# Patient Record
Sex: Female | Born: 1990 | Race: White | Hispanic: No | Marital: Single | Smoking: Never smoker
Health system: Southern US, Community
[De-identification: ages and names within clinical notes are randomized; demographics above are authoritative.]

## PROBLEM LIST (undated history)

## (undated) DIAGNOSIS — M5126 Other intervertebral disc displacement, lumbar region: Secondary | ICD-10-CM

## (undated) HISTORY — PX: BACK SURGERY: SHX140

## (undated) HISTORY — PX: WISDOM TOOTH EXTRACTION: SHX21

## (undated) HISTORY — DX: Other intervertebral disc displacement, lumbar region: M51.26

---
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
## 2009-01-16 ENCOUNTER — Ambulatory Visit: Payer: Self-pay | Admitting: Gynecology

## 2009-02-12 ENCOUNTER — Ambulatory Visit: Payer: Self-pay | Admitting: Gynecology

## 2009-02-12 ENCOUNTER — Other Ambulatory Visit: Admission: RE | Admit: 2009-02-12 | Discharge: 2009-02-12 | Payer: Self-pay | Admitting: Gynecology

## 2009-02-12 ENCOUNTER — Encounter: Payer: Self-pay | Admitting: Gynecology

## 2009-06-15 ENCOUNTER — Ambulatory Visit: Payer: Self-pay | Admitting: Gynecology

## 2009-11-12 ENCOUNTER — Ambulatory Visit: Payer: Self-pay | Admitting: Gynecology

## 2010-02-13 ENCOUNTER — Other Ambulatory Visit: Admission: RE | Admit: 2010-02-13 | Discharge: 2010-02-13 | Payer: Self-pay | Admitting: Gynecology

## 2010-02-13 ENCOUNTER — Ambulatory Visit: Payer: Self-pay | Admitting: Gynecology

## 2010-05-14 ENCOUNTER — Ambulatory Visit: Payer: Self-pay | Admitting: Gynecology

## 2012-04-01 ENCOUNTER — Encounter: Payer: Self-pay | Admitting: Gynecology

## 2012-04-01 ENCOUNTER — Ambulatory Visit (INDEPENDENT_AMBULATORY_CARE_PROVIDER_SITE_OTHER): Payer: Self-pay | Admitting: Gynecology

## 2012-04-01 VITALS — BP 120/80 | Ht 66.5 in | Wt 156.0 lb

## 2012-04-01 DIAGNOSIS — Z01419 Encounter for gynecological examination (general) (routine) without abnormal findings: Secondary | ICD-10-CM

## 2012-04-01 DIAGNOSIS — A499 Bacterial infection, unspecified: Secondary | ICD-10-CM

## 2012-04-01 DIAGNOSIS — N898 Other specified noninflammatory disorders of vagina: Secondary | ICD-10-CM

## 2012-04-01 DIAGNOSIS — B9689 Other specified bacterial agents as the cause of diseases classified elsewhere: Secondary | ICD-10-CM

## 2012-04-01 DIAGNOSIS — Z113 Encounter for screening for infections with a predominantly sexual mode of transmission: Secondary | ICD-10-CM

## 2012-04-01 DIAGNOSIS — N760 Acute vaginitis: Secondary | ICD-10-CM

## 2012-04-01 LAB — CBC WITH DIFFERENTIAL/PLATELET
Basophils Absolute: 0 10*3/uL (ref 0.0–0.1)
Basophils Relative: 0 % (ref 0–1)
Eosinophils Absolute: 0.2 10*3/uL (ref 0.0–0.7)
Eosinophils Relative: 3 % (ref 0–5)
HCT: 38.8 % (ref 36.0–46.0)
Hemoglobin: 12.7 g/dL (ref 12.0–15.0)
Lymphocytes Relative: 34 % (ref 12–46)
Lymphs Abs: 2 10*3/uL (ref 0.7–4.0)
MCH: 29.9 pg (ref 26.0–34.0)
MCHC: 32.7 g/dL (ref 30.0–36.0)
MCV: 91.3 fL (ref 78.0–100.0)
Monocytes Absolute: 0.4 10*3/uL (ref 0.1–1.0)
Monocytes Relative: 7 % (ref 3–12)
Neutro Abs: 3.2 10*3/uL (ref 1.7–7.7)
Neutrophils Relative %: 56 % (ref 43–77)
Platelets: 223 10*3/uL (ref 150–400)
RBC: 4.25 MIL/uL (ref 3.87–5.11)
RDW: 13.6 % (ref 11.5–15.5)
WBC: 5.7 10*3/uL (ref 4.0–10.5)

## 2012-04-01 LAB — WET PREP FOR TRICH, YEAST, CLUE
Trich, Wet Prep: NONE SEEN
Yeast Wet Prep HPF POC: NONE SEEN

## 2012-04-01 MED ORDER — NORGESTIMATE-ETH ESTRADIOL 0.25-35 MG-MCG PO TABS: 1.0000 | ORAL_TABLET | Freq: Every day | ORAL | Status: DC

## 2012-04-01 MED ORDER — METRONIDAZOLE 500 MG PO TABS: 500.0000 mg | ORAL_TABLET | Freq: Two times a day (BID) | ORAL | Status: AC

## 2012-04-01 NOTE — Progress Notes (Signed)
Michelle Anderson 1991-03-10 [id number]        21 y.o.  for annual exam.  Several issues noted below.  Past medical history,surgical history, medications, allergies, family history and social history were all reviewed and documented in the EPIC chart. ROS:  Was performed and pertinent positives and negatives are included in the history.  Exam: Michelle Anderson chaperone present Filed Vitals:   04/01/12 1018  BP: 120/80   General appearance  Normal Skin grossly normal Head/Neck normal with no cervical or supraclavicular adenopathy thyroid normal Lungs  clear Cardiac RR, without RMG Abdominal  soft, nontender, without masses, organomegaly or hernia Breasts  examined lying and sitting without masses, retractions, discharge or axillary adenopathy. Pelvic  Ext/BUS/vagina  normal with white discharge  Cervix  normal   Uterus  anteverted, normal size, shape and contour, midline and mobile nontender   Adnexa  Without masses or tenderness    Anus and perineum  normal      Assessment/Plan:  21 y.o. female for annual exam.    1. Contraception. Patient had stopped the birth control pills and she is now 6 active and has no insurance. I reviewed options of monitoring at present versus starting a generic such as Sprintec. Our recommendations would be to go ahead and start the birth control pill now as then she will have protection if she chooses to become sexually active. Patient agrees and I prescribed Sprintec equivalent x1 year. Back up contraception/decreased STD risk with condoms stressed. 2. Vaginal discharge. Patient notes vaginal discharge with vulvar swelling of the past week or 2. Wet prep/exam consistent with bacterial vaginosis. We'll treat with Flagyl 500 mg twice a day x7 days, alcohol avoidance reviewed. 3. STD screening. Patient requested GC and Chlamydia. No known exposure to just wants to be screened and this was done. 4. Pap smear. Patient's last Pap smear was 2011. No history of abnormal  Pap smears before.  Per current screening guidelines we'll plan every 3 year Pap and no Pap was done today. 5. Breast health. SBE monthly reviewed. 6. Gardasil. Patient has received the series. 7. Health maintenance. Baseline CBC urinalysis ordered. No other blood work done.    Michelle Lords MD, 11:12 AM 04/01/2012

## 2012-04-01 NOTE — Patient Instructions (Signed)
Take Flagyl/metronidazole twice daily for 7 days, alcohol avoidance. Follow up if discharge/swelling persists. Start on oral contraceptives as discussed. Follow up in one year for exam.

## 2012-04-02 LAB — URINALYSIS W MICROSCOPIC + REFLEX CULTURE
Bilirubin Urine: NEGATIVE
Casts: NONE SEEN
Glucose, UA: NEGATIVE mg/dL
Hgb urine dipstick: NEGATIVE
Leukocytes, UA: NEGATIVE
Protein, ur: NEGATIVE mg/dL
pH: 7.5 (ref 5.0–8.0)

## 2012-04-03 LAB — URINE CULTURE

## 2012-04-05 ENCOUNTER — Telehealth: Payer: Self-pay | Admitting: Gynecology

## 2012-04-05 MED ORDER — AMOXICILLIN 500 MG PO TABS: 500.0000 mg | ORAL_TABLET | Freq: Two times a day (BID) | ORAL | Status: AC

## 2012-04-05 NOTE — Telephone Encounter (Signed)
Pt informed with the below. 

## 2012-04-05 NOTE — Telephone Encounter (Signed)
Tell patient that her urine grew out bacteria. I want to treat her with antibiotic. Make sure she uses backup contraception for the month of antibiotic use.

## 2012-10-26 ENCOUNTER — Encounter: Payer: Self-pay | Admitting: Gynecology

## 2012-10-26 ENCOUNTER — Ambulatory Visit (INDEPENDENT_AMBULATORY_CARE_PROVIDER_SITE_OTHER): Payer: BC Managed Care – PPO | Admitting: Gynecology

## 2012-10-26 DIAGNOSIS — N390 Urinary tract infection, site not specified: Secondary | ICD-10-CM

## 2012-10-26 DIAGNOSIS — R300 Dysuria: Secondary | ICD-10-CM

## 2012-10-26 DIAGNOSIS — Z309 Encounter for contraceptive management, unspecified: Secondary | ICD-10-CM

## 2012-10-26 LAB — URINALYSIS W MICROSCOPIC + REFLEX CULTURE
Bilirubin Urine: NEGATIVE
Casts: NONE SEEN
Crystals: NONE SEEN
Hgb urine dipstick: NEGATIVE
Ketones, ur: NEGATIVE mg/dL
RBC / HPF: NONE SEEN RBC/hpf (ref <3)
Specific Gravity, Urine: 1.02 (ref 1.005–1.030)
pH: 7.5 (ref 5.0–8.0)

## 2012-10-26 MED ORDER — SULFAMETHOXAZOLE-TMP DS 800-160 MG PO TABS: 1.0000 | ORAL_TABLET | Freq: Two times a day (BID) | ORAL | Status: DC

## 2012-10-26 NOTE — Progress Notes (Signed)
Patient presents with: 1. Several weeks of slight dysuria. No we'll frequency urgency low back pain fever chills or, pain. She does have a history of UTIs in the past and says she feels like she has a urinary tract infection. 2. Also wants to talk about birth control. Had been on Yasmin have a lot of emotional swings and we tried her on Sprintec and she continued with days swings and she stopped the control pills.   Exam was Set designer Spine straight no CVA tenderness Abdomen soft nontender without masses guarding rebound organomegaly Pelvic external BUS vagina normal. Cervix normal. Uterus normal size, mobile nontender. Adnexa without masses or tenderness.  Assessment and plan: 1. UTI. UA is consistent with a low level cystitis with 3-6 WBCs few bacteria positive leukocyte esterase. We'll cover with Septra DS one by mouth twice a day x3 days. Follow up if symptoms persist or recur. 2. Contraceptive management.  I extensively reviewed options for contraception to include pill patch ring Implanon Depo-Provera IUD. The pros/cons, risks/benefits of each choice reviewed. Options to try a different pill versus IUD discussed. She is not interested in NuvaRing or patch Implanon or Depo-Provera.  What is involved with IUD placement and the risks benefits to include infection/failure and hormonal absorption with hormonal side effects discussed.  Patient wants to try a different pill at first and then if this does not seem to alleviate her emotional swings that she will follow up for Mirena IUD placement. Lo Loestrin samples packs x2 given. Patient will call me into the second pack to let me know how she's doing and either prescribe a refill through her annual come to June/July 2014 or she'll follow for the IUD.

## 2012-10-26 NOTE — Patient Instructions (Signed)
Follow-up with contraceptive decision. 

## 2012-10-28 LAB — URINE CULTURE: Colony Count: NO GROWTH

## 2012-11-15 ENCOUNTER — Telehealth: Payer: Self-pay | Admitting: *Deleted

## 2012-11-15 NOTE — Telephone Encounter (Signed)
Pt called c/o spotting while taking new birth control lo Loestrin FE. Pt said she has been on now for about 2 weeks. Pt is taking daily as directed same time and date. I explained to pt that not usually to have spotting with new pills. Pt will wait and follow up to see if spotting should resolve.

## 2012-12-24 ENCOUNTER — Telehealth: Payer: Self-pay | Admitting: *Deleted

## 2012-12-24 MED ORDER — NORETHIN-ETH ESTRAD-FE BIPHAS 1 MG-10 MCG / 10 MCG PO TABS: 1.0000 | ORAL_TABLET | Freq: Every day | ORAL | Status: DC

## 2012-12-24 NOTE — Telephone Encounter (Signed)
Pt called requesting rx for lo loestrin fe , per note on 10/26/12, rx sent.

## 2013-03-21 ENCOUNTER — Ambulatory Visit (INDEPENDENT_AMBULATORY_CARE_PROVIDER_SITE_OTHER): Payer: BC Managed Care – PPO | Admitting: Gynecology

## 2013-03-21 ENCOUNTER — Encounter: Payer: Self-pay | Admitting: Gynecology

## 2013-03-21 DIAGNOSIS — N390 Urinary tract infection, site not specified: Secondary | ICD-10-CM

## 2013-03-21 DIAGNOSIS — R3915 Urgency of urination: Secondary | ICD-10-CM

## 2013-03-21 DIAGNOSIS — Z113 Encounter for screening for infections with a predominantly sexual mode of transmission: Secondary | ICD-10-CM

## 2013-03-21 LAB — URINALYSIS W MICROSCOPIC + REFLEX CULTURE
Bilirubin Urine: NEGATIVE
Glucose, UA: NEGATIVE mg/dL
Ketones, ur: NEGATIVE mg/dL
Protein, ur: 30 mg/dL — AB
Urobilinogen, UA: 0.2 mg/dL (ref 0.0–1.0)

## 2013-03-21 LAB — WET PREP FOR TRICH, YEAST, CLUE: Clue Cells Wet Prep HPF POC: NONE SEEN

## 2013-03-21 MED ORDER — SULFAMETHOXAZOLE-TRIMETHOPRIM 800-160 MG PO TABS: 1.0000 | ORAL_TABLET | Freq: Two times a day (BID) | ORAL | Status: DC

## 2013-03-21 NOTE — Progress Notes (Signed)
Patient presents with 2 issues: 1. Frequency and urgency. Similar symptoms 2 weeks ago. Took some of her grandmothers antibiotics. Her symptoms resolved but now have returned over the last 24 hours. No fever chills nausea vomiting low back pain. Does have history of UTIs in the past. 2. STD screening request. Boyfriend has been unfaithful. No known exposure but wants to be checked.   Exam with Selena Batten assistant Spine straight without CVA tenderness Abdomen soft nontender without masses guarding rebound organomegaly. Pelvic external BUS vagina normal. Cervix normal. Uterus normal sized mobile nontender. Adnexa without masses or tenderness.  Assessment and plan: 1. Symptoms and urinalysis consistent with UTI. Treat with Septra DS 1 by mouth twice a day x5 days. Followup if symptoms persist, worsen or recur.  2. STD screening. GC chlamydia HIV RPR hepatitis B hepatitis C wet prep done. Wet prep is negative. Followup for other results. Patient has annual exam scheduled end of the month and she'll followup for this.

## 2013-03-21 NOTE — Patient Instructions (Signed)
Take Septra antibiotic twice daily for 5 days. Followup if symptoms persist, worsen or recur. Followup online my chart to check your STD results. Follow up for her annual exam as scheduled.

## 2013-03-22 LAB — GC/CHLAMYDIA PROBE AMP: GC Probe RNA: NEGATIVE

## 2013-03-23 ENCOUNTER — Other Ambulatory Visit: Payer: BC Managed Care – PPO

## 2013-03-23 LAB — URINE CULTURE: Colony Count: NO GROWTH

## 2013-03-24 LAB — RPR

## 2013-04-14 ENCOUNTER — Encounter: Payer: Self-pay | Admitting: Gynecology

## 2013-04-14 ENCOUNTER — Other Ambulatory Visit (HOSPITAL_COMMUNITY)
Admission: RE | Admit: 2013-04-14 | Discharge: 2013-04-14 | Disposition: A | Discharge status: Home / Self Care | Payer: BC Managed Care – PPO | Source: Ambulatory Visit | Attending: Gynecology | Admitting: Gynecology

## 2013-04-14 ENCOUNTER — Ambulatory Visit (INDEPENDENT_AMBULATORY_CARE_PROVIDER_SITE_OTHER): Payer: BC Managed Care – PPO | Admitting: Gynecology

## 2013-04-14 VITALS — BP 112/66 | Ht 66.5 in | Wt 153.0 lb

## 2013-04-14 DIAGNOSIS — Z01419 Encounter for gynecological examination (general) (routine) without abnormal findings: Secondary | ICD-10-CM

## 2013-04-14 LAB — CBC WITH DIFFERENTIAL/PLATELET
Basophils Absolute: 0 10*3/uL (ref 0.0–0.1)
Eosinophils Absolute: 0.1 10*3/uL (ref 0.0–0.7)
Eosinophils Relative: 2 % (ref 0–5)
Lymphocytes Relative: 26 % (ref 12–46)
MCH: 30.5 pg (ref 26.0–34.0)
MCV: 89.8 fL (ref 78.0–100.0)
Neutrophils Relative %: 65 % (ref 43–77)
Platelets: 211 10*3/uL (ref 150–400)
RDW: 14.1 % (ref 11.5–15.5)
WBC: 5.5 10*3/uL (ref 4.0–10.5)

## 2013-04-14 LAB — GLUCOSE, RANDOM: Glucose, Bld: 82 mg/dL (ref 70–99)

## 2013-04-14 MED ORDER — NORETHIN-ETH ESTRAD-FE BIPHAS 1 MG-10 MCG / 10 MCG PO TABS: 1.0000 | ORAL_TABLET | Freq: Every day | ORAL | Status: DC

## 2013-04-14 NOTE — Patient Instructions (Signed)
Follow-up in 1 year for annual exam 

## 2013-04-14 NOTE — Addendum Note (Signed)
Addended by: Dayna Barker on: 04/14/2013 10:08 AM   Modules accepted: Orders

## 2013-04-14 NOTE — Progress Notes (Signed)
Michelle Anderson [birth year] [id number]        22 y.o.  G0P0 for annual exam.  Doing well without complaints.  Past medical history,surgical history, medications, allergies, family history and social history were all reviewed and documented in the EPIC chart.  ROS:  Performed and pertinent positives and negatives are included in the history, assessment and plan .  Exam: Kim assistant Filed Vitals:   04/14/13 0939  BP: 112/66  Height: 5' 6.5" (1.689 m)  Weight: 153 lb (69.4 kg)   General appearance  Normal Skin grossly normal Head/Neck normal with no cervical or supraclavicular adenopathy thyroid normal Lungs  clear Cardiac RR, without RMG Abdominal  soft, nontender, without masses, organomegaly or hernia Breasts  examined lying and sitting without masses, retractions, discharge or axillary adenopathy. Pelvic  Ext/BUS/vagina  normal   Cervix  normal Pap  Uterus  anteverted, normal size, shape and contour, midline and mobile nontender   Adnexa  Without masses or tenderness    Anus and perineum  normal       Assessment/Plan:  22 y.o. G0P0 female for annual exam.   1. Contraception. Patient on low-dose BCPs doing well treated once to continue. I refilled her times a year. 2. Breast health. SBE monthly reviewed. 3. Pap smear 2011. Pap done today. Plan 3 year repeat assuming normal. 4. STD screening recently done in June and all negative. 5. Gardasil series received. 6. Health maintenance. Baseline CBC glucose urinalysis ordered. Followup one year, sooner as needed.  Note: This document was prepared with digital dictation and possible smart phrase technology. Any transcriptional errors that result from this process are unintentional.   Dara Lords MD, 10:01 AM 04/14/2013

## 2013-04-15 LAB — URINALYSIS W MICROSCOPIC + REFLEX CULTURE
Bacteria, UA: NONE SEEN
Bilirubin Urine: NEGATIVE
Ketones, ur: NEGATIVE mg/dL
Protein, ur: NEGATIVE mg/dL
Urobilinogen, UA: 0.2 mg/dL (ref 0.0–1.0)

## 2013-09-09 ENCOUNTER — Other Ambulatory Visit: Payer: Self-pay | Admitting: Neurosurgery

## 2013-09-09 DIAGNOSIS — M5126 Other intervertebral disc displacement, lumbar region: Secondary | ICD-10-CM

## 2013-09-13 ENCOUNTER — Ambulatory Visit
Admission: RE | Admit: 2013-09-13 | Discharge: 2013-09-13 | Disposition: A | Discharge status: Home / Self Care | Payer: BC Managed Care – PPO | Source: Ambulatory Visit | Attending: Neurosurgery | Admitting: Neurosurgery

## 2013-09-13 VITALS — BP 126/81 | HR 64

## 2013-09-13 DIAGNOSIS — M5126 Other intervertebral disc displacement, lumbar region: Secondary | ICD-10-CM

## 2013-09-13 MED ORDER — METHYLPREDNISOLONE ACETATE 40 MG/ML INJ SUSP (RADIOLOG
120.0000 mg | Freq: Once | INTRAMUSCULAR | Status: AC
  Administered 2013-09-13: 120 mg via EPIDURAL

## 2013-09-13 MED ORDER — IOHEXOL 180 MG/ML  SOLN
1.0000 mL | Freq: Once | INTRAMUSCULAR | Status: AC | PRN
  Administered 2013-09-13: 1 mL via EPIDURAL

## 2014-02-01 ENCOUNTER — Other Ambulatory Visit: Payer: Self-pay | Admitting: Gynecology

## 2014-04-18 ENCOUNTER — Ambulatory Visit (INDEPENDENT_AMBULATORY_CARE_PROVIDER_SITE_OTHER): Payer: 59 | Admitting: Gynecology

## 2014-04-18 ENCOUNTER — Encounter: Payer: Self-pay | Admitting: Gynecology

## 2014-04-18 VITALS — BP 110/70 | Ht 67.0 in | Wt 169.0 lb

## 2014-04-18 DIAGNOSIS — Z01419 Encounter for gynecological examination (general) (routine) without abnormal findings: Secondary | ICD-10-CM

## 2014-04-18 DIAGNOSIS — Z113 Encounter for screening for infections with a predominantly sexual mode of transmission: Secondary | ICD-10-CM

## 2014-04-18 LAB — CBC WITH DIFFERENTIAL/PLATELET
BASOS ABS: 0 10*3/uL (ref 0.0–0.1)
Basophils Relative: 0 % (ref 0–1)
EOS ABS: 0.1 10*3/uL (ref 0.0–0.7)
EOS PCT: 3 % (ref 0–5)
HEMATOCRIT: 38.9 % (ref 36.0–46.0)
Hemoglobin: 13.4 g/dL (ref 12.0–15.0)
Lymphocytes Relative: 33 % (ref 12–46)
Lymphs Abs: 1.6 10*3/uL (ref 0.7–4.0)
MCH: 30.7 pg (ref 26.0–34.0)
MCHC: 34.4 g/dL (ref 30.0–36.0)
MCV: 89 fL (ref 78.0–100.0)
MONO ABS: 0.4 10*3/uL (ref 0.1–1.0)
Monocytes Relative: 8 % (ref 3–12)
Neutro Abs: 2.7 10*3/uL (ref 1.7–7.7)
Neutrophils Relative %: 56 % (ref 43–77)
PLATELETS: 202 10*3/uL (ref 150–400)
RBC: 4.37 MIL/uL (ref 3.87–5.11)
RDW: 13.3 % (ref 11.5–15.5)
WBC: 4.8 10*3/uL (ref 4.0–10.5)

## 2014-04-18 LAB — GLUCOSE, RANDOM: Glucose, Bld: 78 mg/dL (ref 70–99)

## 2014-04-18 MED ORDER — NORETHIN-ETH ESTRAD-FE BIPHAS 1 MG-10 MCG / 10 MCG PO TABS: ORAL_TABLET | ORAL | Status: DC

## 2014-04-18 NOTE — Patient Instructions (Signed)
Followup in one year, sooner as needed.  You may obtain a copy of any labs that were done today by logging onto MyChart as outlined in the instructions provided with your AVS (after visit summary). The office will not call with normal lab results but certainly if there are any significant abnormalities then we will contact you.   Health Maintenance, Female A healthy lifestyle and preventative care can promote health and wellness.  Maintain regular health, dental, and eye exams.  Eat a healthy diet. Foods like vegetables, fruits, whole grains, low-fat dairy products, and lean protein foods contain the nutrients you need without too many calories. Decrease your intake of foods high in solid fats, added sugars, and salt. Get information about a proper diet from your caregiver, if necessary.  Regular physical exercise is one of the most important things you can do for your health. Most adults should get at least 150 minutes of moderate-intensity exercise (any activity that increases your heart rate and causes you to sweat) each week. In addition, most adults need muscle-strengthening exercises on 2 or more days a week.   Maintain a healthy weight. The body mass index (BMI) is a screening tool to identify possible weight problems. It provides an estimate of body fat based on height and weight. Your caregiver can help determine your BMI, and can help you achieve or maintain a healthy weight. For adults 20 years and older:  A BMI below 18.5 is considered underweight.  A BMI of 18.5 to 24.9 is normal.  A BMI of 25 to 29.9 is considered overweight.  A BMI of 30 and above is considered obese.  Maintain normal blood lipids and cholesterol by exercising and minimizing your intake of saturated fat. Eat a balanced diet with plenty of fruits and vegetables. Blood tests for lipids and cholesterol should begin at age 41 and be repeated every 5 years. If your lipid or cholesterol levels are high, you are over  50, or you are a high risk for heart disease, you may need your cholesterol levels checked more frequently.Ongoing high lipid and cholesterol levels should be treated with medicines if diet and exercise are not effective.  If you smoke, find out from your caregiver how to quit. If you do not use tobacco, do not start.  Lung cancer screening is recommended for adults aged 49 80 years who are at high risk for developing lung cancer because of a history of smoking. Yearly low-dose computed tomography (CT) is recommended for people who have at least a 30-pack-year history of smoking and are a current smoker or have quit within the past 15 years. A pack year of smoking is smoking an average of 1 pack of cigarettes a day for 1 year (for example: 1 pack a day for 30 years or 2 packs a day for 15 years). Yearly screening should continue until the smoker has stopped smoking for at least 15 years. Yearly screening should also be stopped for people who develop a health problem that would prevent them from having lung cancer treatment.  If you are pregnant, do not drink alcohol. If you are breastfeeding, be very cautious about drinking alcohol. If you are not pregnant and choose to drink alcohol, do not exceed 1 drink per day. One drink is considered to be 12 ounces (355 mL) of beer, 5 ounces (148 mL) of wine, or 1.5 ounces (44 mL) of liquor.  Avoid use of street drugs. Do not share needles with anyone. Ask for help  if you need support or instructions about stopping the use of drugs.  High blood pressure causes heart disease and increases the risk of stroke. Blood pressure should be checked at least every 1 to 2 years. Ongoing high blood pressure should be treated with medicines, if weight loss and exercise are not effective.  If you are 55 to 24 years old, ask your caregiver if you should take aspirin to prevent strokes.  Diabetes screening involves taking a blood sample to check your fasting blood sugar level.  This should be done once every 3 years, after age 45, if you are within normal weight and without risk factors for diabetes. Testing should be considered at a younger age or be carried out more frequently if you are overweight and have at least 1 risk factor for diabetes.  Breast cancer screening is essential preventative care for women. You should practice "breast self-awareness." This means understanding the normal appearance and feel of your breasts and may include breast self-examination. Any changes detected, no matter how small, should be reported to a caregiver. Women in their 20s and 30s should have a clinical breast exam (CBE) by a caregiver as part of a regular health exam every 1 to 3 years. After age 40, women should have a CBE every year. Starting at age 40, women should consider having a mammogram (breast X-ray) every year. Women who have a family history of breast cancer should talk to their caregiver about genetic screening. Women at a high risk of breast cancer should talk to their caregiver about having an MRI and a mammogram every year.  Breast cancer gene (BRCA)-related cancer risk assessment is recommended for women who have family members with BRCA-related cancers. BRCA-related cancers include breast, ovarian, tubal, and peritoneal cancers. Having family members with these cancers may be associated with an increased risk for harmful changes (mutations) in the breast cancer genes BRCA1 and BRCA2. Results of the assessment will determine the need for genetic counseling and BRCA1 and BRCA2 testing.  The Pap test is a screening test for cervical cancer. Women should have a Pap test starting at age 21. Between ages 21 and 29, Pap tests should be repeated every 2 years. Beginning at age 30, you should have a Pap test every 3 years as long as the past 3 Pap tests have been normal. If you had a hysterectomy for a problem that was not cancer or a condition that could lead to cancer, then you no  longer need Pap tests. If you are between ages 65 and 70, and you have had normal Pap tests going back 10 years, you no longer need Pap tests. If you have had past treatment for cervical cancer or a condition that could lead to cancer, you need Pap tests and screening for cancer for at least 20 years after your treatment. If Pap tests have been discontinued, risk factors (such as a new sexual partner) need to be reassessed to determine if screening should be resumed. Some women have medical problems that increase the chance of getting cervical cancer. In these cases, your caregiver may recommend more frequent screening and Pap tests.  The human papillomavirus (HPV) test is an additional test that may be used for cervical cancer screening. The HPV test looks for the virus that can cause the cell changes on the cervix. The cells collected during the Pap test can be tested for HPV. The HPV test could be used to screen women aged 30 years and older, and   should be used in women of any age who have unclear Pap test results. After the age of 30, women should have HPV testing at the same frequency as a Pap test.  Colorectal cancer can be detected and often prevented. Most routine colorectal cancer screening begins at the age of 50 and continues through age 75. However, your caregiver may recommend screening at an earlier age if you have risk factors for colon cancer. On a yearly basis, your caregiver may provide home test kits to check for hidden blood in the stool. Use of a small camera at the end of a tube, to directly examine the colon (sigmoidoscopy or colonoscopy), can detect the earliest forms of colorectal cancer. Talk to your caregiver about this at age 50, when routine screening begins. Direct examination of the colon should be repeated every 5 to 10 years through age 75, unless early forms of pre-cancerous polyps or small growths are found.  Hepatitis C blood testing is recommended for all people born from  1945 through 1965 and any individual with known risks for hepatitis C.  Practice safe sex. Use condoms and avoid high-risk sexual practices to reduce the spread of sexually transmitted infections (STIs). Sexually active women aged 25 and younger should be checked for Chlamydia, which is a common sexually transmitted infection. Older women with new or multiple partners should also be tested for Chlamydia. Testing for other STIs is recommended if you are sexually active and at increased risk.  Osteoporosis is a disease in which the bones lose minerals and strength with aging. This can result in serious bone fractures. The risk of osteoporosis can be identified using a bone density scan. Women ages 65 and over and women at risk for fractures or osteoporosis should discuss screening with their caregivers. Ask your caregiver whether you should be taking a calcium supplement or vitamin D to reduce the rate of osteoporosis.  Menopause can be associated with physical symptoms and risks. Hormone replacement therapy is available to decrease symptoms and risks. You should talk to your caregiver about whether hormone replacement therapy is right for you.  Use sunscreen. Apply sunscreen liberally and repeatedly throughout the day. You should seek shade when your shadow is shorter than you. Protect yourself by wearing long sleeves, pants, a wide-brimmed hat, and sunglasses year round, whenever you are outdoors.  Notify your caregiver of new moles or changes in moles, especially if there is a change in shape or color. Also notify your caregiver if a mole is larger than the size of a pencil eraser.  Stay current with your immunizations. Document Released: 04/14/2011 Document Revised: 01/24/2013 Document Reviewed: 04/14/2011 [employer] Patient Information 2014 [employer].   

## 2014-04-18 NOTE — Progress Notes (Signed)
Michelle Anderson 1991-05-11 [id number]        23 y.o.  G0P0 for annual exam.  Doing well.  Past medical history,surgical history, problem list, medications, allergies, family history and social history were all reviewed and documented as reviewed in the EPIC chart.  ROS:  12 system ROS performed with pertinent positives and negatives included in the history, assessment and plan.   Additional significant findings :  none   Exam: [facility] personassistant Filed Vitals:   04/18/14 1010  BP: 110/70  Height: 5\' 7"  (1.702 m)  Weight: 169 lb (76.658 kg)   General appearance:  Normal affect, orientation and appearance. Skin: Grossly normal HEENT: Without gross lesions.  No cervical or supraclavicular adenopathy. Thyroid normal.  Lungs:  Clear without wheezing, rales or rhonchi Cardiac: RR, without RMG Abdominal:  Soft, nontender, without masses, guarding, rebound, organomegaly or hernia Breasts:  Examined lying and sitting without masses, retractions, discharge or axillary adenopathy. Pelvic:  Ext/BUS/vagina normal  Cervix normal. GC/Chlamydia done  Uterus anteverted, normal size, shape and contour, midline and mobile nontender   Adnexa  Without masses or tenderness    Anus and perineum  Normal       Assessment/Plan:  23 y.o. G0P0 female for annual exam with mild irregular menses, oral contraceptives.   1. Contraceptive management. Patient on LoLoEstrin doing well. She does have occasional skips in her menses where she will not withdraw during her pill free week. Reviewed that this is not unusual with low estrogen pills as long as she's comfortable with this from a rule out pregnancy standpoint then she can continue. She is comfortable with this I refilled her LoLoEstrin x1 year. 2. STD screening. Reviewed STD screening with her and she chose GC/chlamydia, hepatitis B/C, RPR, HIV. Currently not sexually active but need to use condoms when reviewed. 3. Breast health. SBE monthly reviewed. 4. Pap  smear 2014 negative. The Pap smear done today. No history of abnormal Pap smears previously.Plan repeat at 3 year interval per current screening guidelines. 5. Gardasil series received. 6. Health maintenance. Glucose last CBC ordered along with her STD blood work. Followup in one year, sooner as needed.   Note: This document was prepared with digital dictation and possible smart phrase technology. Any transcriptional errors that result from this process are unintentional.   Dara LordsFONTAINE,Willem Klingensmith P MD, 10:35 AM 04/18/2014

## 2014-04-19 LAB — HEPATITIS B SURFACE ANTIGEN: HEP B S AG: NEGATIVE

## 2014-04-19 LAB — GC/CHLAMYDIA PROBE AMP
CT PROBE, AMP APTIMA: NEGATIVE
GC PROBE AMP APTIMA: NEGATIVE

## 2014-04-19 LAB — HIV ANTIBODY (ROUTINE TESTING W REFLEX): HIV: NONREACTIVE

## 2014-04-19 LAB — HEPATITIS C ANTIBODY: HCV Ab: NEGATIVE

## 2014-04-19 LAB — RPR

## 2015-03-15 ENCOUNTER — Other Ambulatory Visit: Payer: Self-pay | Admitting: Gynecology

## 2015-04-05 ENCOUNTER — Other Ambulatory Visit: Payer: Self-pay | Admitting: Gynecology

## 2015-04-26 ENCOUNTER — Encounter: Payer: Self-pay | Admitting: Gynecology

## 2015-04-26 ENCOUNTER — Ambulatory Visit (INDEPENDENT_AMBULATORY_CARE_PROVIDER_SITE_OTHER): Payer: 59 | Admitting: Gynecology

## 2015-04-26 VITALS — BP 120/70 | Ht 67.0 in | Wt 193.0 lb

## 2015-04-26 DIAGNOSIS — Z01419 Encounter for gynecological examination (general) (routine) without abnormal findings: Secondary | ICD-10-CM

## 2015-04-26 DIAGNOSIS — Z113 Encounter for screening for infections with a predominantly sexual mode of transmission: Secondary | ICD-10-CM

## 2015-04-26 LAB — COMPREHENSIVE METABOLIC PANEL
ALT: 12 U/L (ref 0–35)
AST: 13 U/L (ref 0–37)
Albumin: 3.9 g/dL (ref 3.5–5.2)
Alkaline Phosphatase: 33 U/L — ABNORMAL LOW (ref 39–117)
BILIRUBIN TOTAL: 0.9 mg/dL (ref 0.2–1.2)
BUN: 10 mg/dL (ref 6–23)
CHLORIDE: 101 meq/L (ref 96–112)
CO2: 25 mEq/L (ref 19–32)
CREATININE: 0.75 mg/dL (ref 0.50–1.10)
Calcium: 9.2 mg/dL (ref 8.4–10.5)
GLUCOSE: 101 mg/dL — AB (ref 70–99)
Potassium: 3.9 mEq/L (ref 3.5–5.3)
Sodium: 139 mEq/L (ref 135–145)
Total Protein: 6.9 g/dL (ref 6.0–8.3)

## 2015-04-26 LAB — CBC WITH DIFFERENTIAL/PLATELET
Basophils Absolute: 0 10*3/uL (ref 0.0–0.1)
Basophils Relative: 0 % (ref 0–1)
EOS PCT: 2 % (ref 0–5)
Eosinophils Absolute: 0.1 10*3/uL (ref 0.0–0.7)
HCT: 36.3 % (ref 36.0–46.0)
Hemoglobin: 12.2 g/dL (ref 12.0–15.0)
LYMPHS ABS: 1.5 10*3/uL (ref 0.7–4.0)
LYMPHS PCT: 28 % (ref 12–46)
MCH: 30.1 pg (ref 26.0–34.0)
MCHC: 33.6 g/dL (ref 30.0–36.0)
MCV: 89.6 fL (ref 78.0–100.0)
MONOS PCT: 9 % (ref 3–12)
MPV: 10.7 fL (ref 8.6–12.4)
Monocytes Absolute: 0.5 10*3/uL (ref 0.1–1.0)
NEUTROS PCT: 61 % (ref 43–77)
Neutro Abs: 3.2 10*3/uL (ref 1.7–7.7)
Platelets: 219 10*3/uL (ref 150–400)
RBC: 4.05 MIL/uL (ref 3.87–5.11)
RDW: 13.1 % (ref 11.5–15.5)
WBC: 5.3 10*3/uL (ref 4.0–10.5)

## 2015-04-26 LAB — LIPID PANEL
CHOL/HDL RATIO: 2.5 ratio
Cholesterol: 157 mg/dL (ref 0–200)
HDL: 62 mg/dL (ref >=46)
LDL Cholesterol: 77 mg/dL (ref 0–99)
TRIGLYCERIDES: 90 mg/dL (ref <150)
VLDL: 18 mg/dL (ref 0–40)

## 2015-04-26 MED ORDER — NORETHIN-ETH ESTRAD-FE BIPHAS 1 MG-10 MCG / 10 MCG PO TABS: 1.0000 | ORAL_TABLET | Freq: Every day | ORAL | Status: DC

## 2015-04-26 NOTE — Patient Instructions (Signed)
You may obtain a copy of any labs that were done today by logging onto MyChart as outlined in the instructions provided with your AVS (after visit summary). The office will not call with normal lab results but certainly if there are any significant abnormalities then we will contact you.   Health Maintenance, Female A healthy lifestyle and preventative care can promote health and wellness.  Maintain regular health, dental, and eye exams.  Eat a healthy diet. Foods like vegetables, fruits, whole grains, low-fat dairy products, and lean protein foods contain the nutrients you need without too many calories. Decrease your intake of foods high in solid fats, added sugars, and salt. Get information about a proper diet from your caregiver, if necessary.  Regular physical exercise is one of the most important things you can do for your health. Most adults should get at least 150 minutes of moderate-intensity exercise (any activity that increases your heart rate and causes you to sweat) each week. In addition, most adults need muscle-strengthening exercises on 2 or more days a week.   Maintain a healthy weight. The body mass index (BMI) is a screening tool to identify possible weight problems. It provides an estimate of body fat based on height and weight. Your caregiver can help determine your BMI, and can help you achieve or maintain a healthy weight. For adults 20 years and older:  A BMI below 18.5 is considered underweight.  A BMI of 18.5 to 24.9 is normal.  A BMI of 25 to 29.9 is considered overweight.  A BMI of 30 and above is considered obese.  Maintain normal blood lipids and cholesterol by exercising and minimizing your intake of saturated fat. Eat a balanced diet with plenty of fruits and vegetables. Blood tests for lipids and cholesterol should begin at age 61 and be repeated every 5 years. If your lipid or cholesterol levels are high, you are over 50, or you are a high risk for heart  disease, you may need your cholesterol levels checked more frequently.Ongoing high lipid and cholesterol levels should be treated with medicines if diet and exercise are not effective.  If you smoke, find out from your caregiver how to quit. If you do not use tobacco, do not start.  Lung cancer screening is recommended for adults aged 33 80 years who are at high risk for developing lung cancer because of a history of smoking. Yearly low-dose computed tomography (CT) is recommended for people who have at least a 30-pack-year history of smoking and are a current smoker or have quit within the past 15 years. A pack year of smoking is smoking an average of 1 pack of cigarettes a day for 1 year (for example: 1 pack a day for 30 years or 2 packs a day for 15 years). Yearly screening should continue until the smoker has stopped smoking for at least 15 years. Yearly screening should also be stopped for people who develop a health problem that would prevent them from having lung cancer treatment.  If you are pregnant, do not drink alcohol. If you are breastfeeding, be very cautious about drinking alcohol. If you are not pregnant and choose to drink alcohol, do not exceed 1 drink per day. One drink is considered to be 12 ounces (355 mL) of beer, 5 ounces (148 mL) of wine, or 1.5 ounces (44 mL) of liquor.  Avoid use of street drugs. Do not share needles with anyone. Ask for help if you need support or instructions about stopping  the use of drugs.  High blood pressure causes heart disease and increases the risk of stroke. Blood pressure should be checked at least every 1 to 2 years. Ongoing high blood pressure should be treated with medicines, if weight loss and exercise are not effective.  If you are 59 to 25 years old, ask your caregiver if you should take aspirin to prevent strokes.  Diabetes screening involves taking a blood sample to check your fasting blood sugar level. This should be done once every 3  years, after age 91, if you are within normal weight and without risk factors for diabetes. Testing should be considered at a younger age or be carried out more frequently if you are overweight and have at least 1 risk factor for diabetes.  Breast cancer screening is essential preventative care for women. You should practice "breast self-awareness." This means understanding the normal appearance and feel of your breasts and may include breast self-examination. Any changes detected, no matter how small, should be reported to a caregiver. Women in their 66s and 30s should have a clinical breast exam (CBE) by a caregiver as part of a regular health exam every 1 to 3 years. After age 101, women should have a CBE every year. Starting at age 100, women should consider having a mammogram (breast X-ray) every year. Women who have a family history of breast cancer should talk to their caregiver about genetic screening. Women at a high risk of breast cancer should talk to their caregiver about having an MRI and a mammogram every year.  Breast cancer gene (BRCA)-related cancer risk assessment is recommended for women who have family members with BRCA-related cancers. BRCA-related cancers include breast, ovarian, tubal, and peritoneal cancers. Having family members with these cancers may be associated with an increased risk for harmful changes (mutations) in the breast cancer genes BRCA1 and BRCA2. Results of the assessment will determine the need for genetic counseling and BRCA1 and BRCA2 testing.  The Pap test is a screening test for cervical cancer. Women should have a Pap test starting at age 57. Between ages 25 and 35, Pap tests should be repeated every 2 years. Beginning at age 37, you should have a Pap test every 3 years as long as the past 3 Pap tests have been normal. If you had a hysterectomy for a problem that was not cancer or a condition that could lead to cancer, then you no longer need Pap tests. If you are  between ages 50 and 76, and you have had normal Pap tests going back 10 years, you no longer need Pap tests. If you have had past treatment for cervical cancer or a condition that could lead to cancer, you need Pap tests and screening for cancer for at least 20 years after your treatment. If Pap tests have been discontinued, risk factors (such as a new sexual partner) need to be reassessed to determine if screening should be resumed. Some women have medical problems that increase the chance of getting cervical cancer. In these cases, your caregiver may recommend more frequent screening and Pap tests.  The human papillomavirus (HPV) test is an additional test that may be used for cervical cancer screening. The HPV test looks for the virus that can cause the cell changes on the cervix. The cells collected during the Pap test can be tested for HPV. The HPV test could be used to screen women aged 44 years and older, and should be used in women of any age  who have unclear Pap test results. After the age of 55, women should have HPV testing at the same frequency as a Pap test.  Colorectal cancer can be detected and often prevented. Most routine colorectal cancer screening begins at the age of 44 and continues through age 20. However, your caregiver may recommend screening at an earlier age if you have risk factors for colon cancer. On a yearly basis, your caregiver may provide home test kits to check for hidden blood in the stool. Use of a small camera at the end of a tube, to directly examine the colon (sigmoidoscopy or colonoscopy), can detect the earliest forms of colorectal cancer. Talk to your caregiver about this at age 86, when routine screening begins. Direct examination of the colon should be repeated every 5 to 10 years through age 13, unless early forms of pre-cancerous polyps or small growths are found.  Hepatitis C blood testing is recommended for all people born from [birth year] through 1966 and any  individual with known risks for hepatitis C.  Practice safe sex. Use condoms and avoid high-risk sexual practices to reduce the spread of sexually transmitted infections (STIs). Sexually active women aged 36 and younger should be checked for Chlamydia, which is a common sexually transmitted infection. Older women with new or multiple partners should also be tested for Chlamydia. Testing for other STIs is recommended if you are sexually active and at increased risk.  Osteoporosis is a disease in which the bones lose minerals and strength with aging. This can result in serious bone fractures. The risk of osteoporosis can be identified using a bone density scan. Women ages 20 and over and women at risk for fractures or osteoporosis should discuss screening with their caregivers. Ask your caregiver whether you should be taking a calcium supplement or vitamin D to reduce the rate of osteoporosis.  Menopause can be associated with physical symptoms and risks. Hormone replacement therapy is available to decrease symptoms and risks. You should talk to your caregiver about whether hormone replacement therapy is right for you.  Use sunscreen. Apply sunscreen liberally and repeatedly throughout the day. You should seek shade when your shadow is shorter than you. Protect yourself by wearing long sleeves, pants, a wide-brimmed hat, and sunglasses year round, whenever you are outdoors.  Notify your caregiver of new moles or changes in moles, especially if there is a change in shape or color. Also notify your caregiver if a mole is larger than the size of a pencil eraser.  Stay current with your immunizations. Document Released: 04/14/2011 Document Revised: 01/24/2013 Document Reviewed: 04/14/2011 [facility] Patient Information 2014 [employer].

## 2015-04-26 NOTE — Progress Notes (Signed)
Michelle Anderson 05/16/1991 [id number]        24 y.o.  G0P0 for annual exam.  Doing well without complaints.  Past medical history,surgical history, problem list, medications, allergies, family history and social history were all reviewed and documented as reviewed in the EPIC chart.  ROS:  Performed with pertinent positives and negatives included in the history, assessment and plan.   Additional significant findings :  none   Exam: [facility] personassistant Filed Vitals:   04/26/15 1119  BP: 120/70  Height: 5\' 7"  (1.702 m)  Weight: 193 lb (87.544 kg)   General appearance:  Normal affect, orientation and appearance. Skin: Grossly normal HEENT: Without gross lesions.  No cervical or supraclavicular adenopathy. Thyroid normal.  Lungs:  Clear without wheezing, rales or rhonchi Cardiac: RR, without RMG Abdominal:  Soft, nontender, without masses, guarding, rebound, organomegaly or hernia Breasts:  Examined lying and sitting without masses, retractions, discharge or axillary adenopathy. Pelvic:  Ext/BUS/vagina normal  Cervix normal. GC/Chlamydia  Uterus anteverted, normal size, shape and contour, midline and mobile nontender   Adnexa  Without masses or tenderness    Anus and perineum  Normal    Assessment/Plan:  24 y.o. G0P0 female for annual exam with regular menses, oral contraceptives.   1. Contraceptive management. Patient doing well on LoLoestrin. Refill 1 year provided. 2. STD screening. GC/Chlamydia done. Declines serum screening. 3. Breast health. SBE monthly reviewed. 4. Pap smear 2014 negative. No Pap smear done today. No history of abnormal Pap smears. Plan repeat Pap smear next year at three-year interval. 5. Gardasil series received. 6. Health maintenance. Baseline CBC comprehensive metabolic panel lipid profile urinalysis ordered. Follow up in one year, sooner as needed.   Dara LordsFONTAINE,[name] P MD, 11:44 AM 04/26/2015

## 2015-04-26 NOTE — Addendum Note (Signed)
Addended by: Dayna BarkerGARDNER, KIMBERLY K on: 04/26/2015 02:14 PM   Modules accepted: Orders

## 2015-04-27 LAB — URINALYSIS W MICROSCOPIC + REFLEX CULTURE
Bacteria, UA: NONE SEEN
Bilirubin Urine: NEGATIVE
Casts: NONE SEEN
Crystals: NONE SEEN
Glucose, UA: NEGATIVE mg/dL
Hgb urine dipstick: NEGATIVE
Ketones, ur: NEGATIVE mg/dL
Leukocytes, UA: NEGATIVE
Nitrite: NEGATIVE
Protein, ur: NEGATIVE mg/dL
Specific Gravity, Urine: 1.019 (ref 1.005–1.030)
Urobilinogen, UA: 0.2 mg/dL (ref 0.0–1.0)
pH: 6 (ref 5.0–8.0)

## 2015-04-28 LAB — GC/CHLAMYDIA PROBE AMP
CT PROBE, AMP APTIMA: NEGATIVE
GC PROBE AMP APTIMA: NEGATIVE

## 2015-06-15 ENCOUNTER — Other Ambulatory Visit: Payer: Self-pay | Admitting: Sports Medicine

## 2015-06-15 DIAGNOSIS — M545 Low back pain: Secondary | ICD-10-CM

## 2015-06-25 ENCOUNTER — Ambulatory Visit
Admission: RE | Admit: 2015-06-25 | Discharge: 2015-06-25 | Disposition: A | Discharge status: Home / Self Care | Payer: 59 | Source: Ambulatory Visit | Attending: Sports Medicine | Admitting: Sports Medicine

## 2015-06-25 DIAGNOSIS — M545 Low back pain: Secondary | ICD-10-CM

## 2015-06-25 MED ORDER — METHYLPREDNISOLONE ACETATE 40 MG/ML INJ SUSP (RADIOLOG
120.0000 mg | Freq: Once | INTRAMUSCULAR | Status: AC
  Administered 2015-06-25: 120 mg via EPIDURAL

## 2015-06-25 MED ORDER — IOHEXOL 180 MG/ML  SOLN
1.0000 mL | Freq: Once | INTRAMUSCULAR | Status: DC | PRN
  Administered 2015-06-25: 1 mL via EPIDURAL

## 2015-06-25 NOTE — Discharge Instructions (Signed)

## 2015-06-26 ENCOUNTER — Other Ambulatory Visit: Payer: Self-pay

## 2015-11-28 ENCOUNTER — Other Ambulatory Visit: Payer: Self-pay | Admitting: Sports Medicine

## 2015-11-28 DIAGNOSIS — M545 Low back pain: Principal | ICD-10-CM

## 2015-11-28 DIAGNOSIS — G8929 Other chronic pain: Secondary | ICD-10-CM

## 2015-12-04 ENCOUNTER — Ambulatory Visit
Admission: RE | Admit: 2015-12-04 | Discharge: 2015-12-04 | Disposition: A | Discharge status: Home / Self Care | Payer: BLUE CROSS/BLUE SHIELD | Source: Ambulatory Visit | Attending: Sports Medicine | Admitting: Sports Medicine

## 2015-12-04 DIAGNOSIS — G8929 Other chronic pain: Secondary | ICD-10-CM

## 2015-12-04 DIAGNOSIS — M545 Low back pain, unspecified: Secondary | ICD-10-CM

## 2015-12-04 MED ORDER — IOHEXOL 180 MG/ML  SOLN
1.0000 mL | Freq: Once | INTRAMUSCULAR | Status: AC | PRN
  Administered 2015-12-04: 1 mL via EPIDURAL

## 2015-12-04 MED ORDER — METHYLPREDNISOLONE ACETATE 40 MG/ML INJ SUSP (RADIOLOG
120.0000 mg | Freq: Once | INTRAMUSCULAR | Status: AC
  Administered 2015-12-04: 120 mg via EPIDURAL

## 2016-03-06 ENCOUNTER — Other Ambulatory Visit: Payer: Self-pay | Admitting: Gynecology

## 2016-04-11 ENCOUNTER — Other Ambulatory Visit: Payer: Self-pay | Admitting: Sports Medicine

## 2016-04-11 DIAGNOSIS — M545 Low back pain, unspecified: Secondary | ICD-10-CM

## 2016-04-11 DIAGNOSIS — G8929 Other chronic pain: Secondary | ICD-10-CM

## 2016-04-22 ENCOUNTER — Ambulatory Visit
Admission: RE | Admit: 2016-04-22 | Discharge: 2016-04-22 | Disposition: A | Discharge status: Home / Self Care | Payer: BLUE CROSS/BLUE SHIELD | Source: Ambulatory Visit | Attending: Sports Medicine | Admitting: Sports Medicine

## 2016-04-22 DIAGNOSIS — M545 Low back pain: Principal | ICD-10-CM

## 2016-04-22 DIAGNOSIS — G8929 Other chronic pain: Secondary | ICD-10-CM

## 2016-04-22 MED ORDER — IOPAMIDOL (ISOVUE-M 200) INJECTION 41%
1.0000 mL | Freq: Once | INTRAMUSCULAR | Status: AC
  Administered 2016-04-22: 1 mL via EPIDURAL

## 2016-04-22 MED ORDER — METHYLPREDNISOLONE ACETATE 40 MG/ML INJ SUSP (RADIOLOG
120.0000 mg | Freq: Once | INTRAMUSCULAR | Status: AC
  Administered 2016-04-22: 120 mg via EPIDURAL

## 2016-04-22 NOTE — Discharge Instructions (Signed)

## 2016-05-19 ENCOUNTER — Other Ambulatory Visit: Payer: Self-pay | Admitting: Gynecology

## 2016-06-12 ENCOUNTER — Other Ambulatory Visit: Payer: Self-pay | Admitting: Gynecology

## 2016-06-17 ENCOUNTER — Other Ambulatory Visit: Payer: Self-pay | Admitting: Gynecology

## 2016-06-24 ENCOUNTER — Encounter: Payer: Self-pay | Admitting: Gynecology

## 2016-06-24 ENCOUNTER — Ambulatory Visit (INDEPENDENT_AMBULATORY_CARE_PROVIDER_SITE_OTHER): Payer: BLUE CROSS/BLUE SHIELD | Admitting: Gynecology

## 2016-06-24 VITALS — BP 112/70 | Ht 67.0 in | Wt 207.0 lb

## 2016-06-24 DIAGNOSIS — Z113 Encounter for screening for infections with a predominantly sexual mode of transmission: Secondary | ICD-10-CM | POA: Diagnosis not present

## 2016-06-24 DIAGNOSIS — Z01419 Encounter for gynecological examination (general) (routine) without abnormal findings: Secondary | ICD-10-CM

## 2016-06-24 LAB — CBC WITH DIFFERENTIAL/PLATELET
BASOS PCT: 0 %
Basophils Absolute: 0 cells/uL (ref 0–200)
EOS ABS: 198 {cells}/uL (ref 15–500)
Eosinophils Relative: 3 %
HEMATOCRIT: 37.8 % (ref 35.0–45.0)
HEMOGLOBIN: 12.7 g/dL (ref 11.7–15.5)
LYMPHS ABS: 1782 {cells}/uL (ref 850–3900)
LYMPHS PCT: 27 %
MCH: 30.5 pg (ref 27.0–33.0)
MCHC: 33.6 g/dL (ref 32.0–36.0)
MCV: 90.9 fL (ref 80.0–100.0)
MONO ABS: 528 {cells}/uL (ref 200–950)
MPV: 10.6 fL (ref 7.5–12.5)
Monocytes Relative: 8 %
Neutro Abs: 4092 cells/uL (ref 1500–7800)
Neutrophils Relative %: 62 %
Platelets: 256 10*3/uL (ref 140–400)
RBC: 4.16 MIL/uL (ref 3.80–5.10)
RDW: 13.7 % (ref 11.0–15.0)
WBC: 6.6 10*3/uL (ref 3.8–10.8)

## 2016-06-24 LAB — GLUCOSE, RANDOM: Glucose, Bld: 84 mg/dL (ref 65–99)

## 2016-06-24 MED ORDER — NORETHIN-ETH ESTRAD-FE BIPHAS 1 MG-10 MCG / 10 MCG PO TABS: 1.0000 | ORAL_TABLET | Freq: Every day | ORAL | 12 refills | Status: DC

## 2016-06-24 NOTE — Progress Notes (Signed)
    Michelle Anderson Mar 09, 1991 [id number]        25 y.o.  G0P0  for annual exam.  Doing well without complaints.  Past medical history,surgical history, problem list, medications, allergies, family history and social history were all reviewed and documented as reviewed in the EPIC chart.  ROS:  Performed with pertinent positives and negatives included in the history, assessment and plan.   Additional significant findings :  None   Exam: Kennon PortelaKim Anderson assistant Vitals:   06/24/16 1039  BP: 112/70  Weight: 207 lb (93.9 kg)  Height: 5\' 7"  (1.702 m)   Body mass index is 32.42 kg/m.  General appearance:  Normal affect, orientation and appearance. Skin: Grossly normal HEENT: Without gross lesions.  No cervical or supraclavicular adenopathy. Thyroid normal.  Lungs:  Clear without wheezing, rales or rhonchi Cardiac: RR, without RMG Abdominal:  Soft, nontender, without masses, guarding, rebound, organomegaly or hernia Breasts:  Examined lying and sitting without masses, retractions, discharge or axillary adenopathy. Pelvic:  Ext/BUS/Vagina normal  Cervix normal. Pap smear, GC/chlamydia  Uterus anteverted, normal size, shape and contour, midline and mobile nontender   Adnexa without masses or tenderness    Anus and perineum normal     Assessment/Plan:  [age] y.o. G0P0 female for annual exam with regular menses, oral contraceptives..   1. Contraception.  Doing well with LoLoestrin. Refill 1 year provided. 2. STD screening. GC/Chlamydia done. Serum screening declined. 3. Breast health. SBE monthly reviewed. 4. Pap smear 2014. Pap smear done today. No history of abnormal Pap smears. 5. Gardasil series received. 6. Health maintenance. Baseline CBC, glucose urinalysis done. Lipid profile, CMP normal last year.  Follow up 1 year, sooner as needed.   Dara LordsFONTAINE,[name] P MD, 10:58 AM 06/24/2016

## 2016-06-24 NOTE — Patient Instructions (Signed)

## 2016-06-24 NOTE — Addendum Note (Signed)
Addended by: Dayna BarkerGARDNER, Karim Aiello K on: 06/24/2016 11:14 AM   Modules accepted: Orders

## 2016-06-25 LAB — URINALYSIS W MICROSCOPIC + REFLEX CULTURE
Bilirubin Urine: NEGATIVE
CASTS: NONE SEEN [LPF]
Crystals: NONE SEEN [HPF]
Glucose, UA: NEGATIVE
HGB URINE DIPSTICK: NEGATIVE
Ketones, ur: NEGATIVE
NITRITE: NEGATIVE
PH: 6 (ref 5.0–8.0)
PROTEIN: NEGATIVE
Specific Gravity, Urine: 1.02 (ref 1.001–1.035)
YEAST: NONE SEEN [HPF]

## 2016-06-25 LAB — PAP IG W/ RFLX HPV ASCU

## 2016-06-26 LAB — URINE CULTURE

## 2016-06-26 LAB — GC/CHLAMYDIA PROBE AMP
CT PROBE, AMP APTIMA: NOT DETECTED
GC PROBE AMP APTIMA: NOT DETECTED

## 2016-07-12 ENCOUNTER — Other Ambulatory Visit: Payer: Self-pay | Admitting: Gynecology

## 2016-07-14 ENCOUNTER — Encounter: Payer: Self-pay | Admitting: Gynecology

## 2016-07-14 ENCOUNTER — Other Ambulatory Visit: Payer: Self-pay | Admitting: Gynecology

## 2016-07-14 ENCOUNTER — Ambulatory Visit (INDEPENDENT_AMBULATORY_CARE_PROVIDER_SITE_OTHER): Payer: BLUE CROSS/BLUE SHIELD | Admitting: Gynecology

## 2016-07-14 VITALS — BP 130/80

## 2016-07-14 DIAGNOSIS — N941 Unspecified dyspareunia: Secondary | ICD-10-CM | POA: Diagnosis not present

## 2016-07-14 DIAGNOSIS — R350 Frequency of micturition: Secondary | ICD-10-CM | POA: Diagnosis not present

## 2016-07-14 DIAGNOSIS — N3000 Acute cystitis without hematuria: Secondary | ICD-10-CM | POA: Diagnosis not present

## 2016-07-14 DIAGNOSIS — N760 Acute vaginitis: Secondary | ICD-10-CM

## 2016-07-14 LAB — URINALYSIS W MICROSCOPIC + REFLEX CULTURE
BILIRUBIN URINE: NEGATIVE
Casts: NONE SEEN [LPF]
Crystals: NONE SEEN [HPF]
GLUCOSE, UA: NEGATIVE
KETONES UR: NEGATIVE
Nitrite: NEGATIVE
PH: 7 (ref 5.0–8.0)
Protein, ur: NEGATIVE
Specific Gravity, Urine: 1.02 (ref 1.001–1.035)
WBC, UA: >60 WBC/HPF — AB (ref <=5)
YEAST: NONE SEEN [HPF]

## 2016-07-14 LAB — WET PREP FOR TRICH, YEAST, CLUE: TRICH WET PREP: NONE SEEN

## 2016-07-14 MED ORDER — FLUCONAZOLE 150 MG PO TABS: 150.0000 mg | ORAL_TABLET | Freq: Once | ORAL | 0 refills | Status: AC

## 2016-07-14 MED ORDER — NITROFURANTOIN MONOHYD MACRO 100 MG PO CAPS: 100.0000 mg | ORAL_CAPSULE | Freq: Two times a day (BID) | ORAL | 0 refills | Status: DC

## 2016-07-14 MED ORDER — METRONIDAZOLE 500 MG PO TABS: 500.0000 mg | ORAL_TABLET | Freq: Two times a day (BID) | ORAL | 0 refills | Status: DC

## 2016-07-14 NOTE — Progress Notes (Signed)
    Michelle Anderson 26-Aug-1991 [id number]        25 y.o.  G0P0 presents with:  1. Urinary frequency, lower abdominal discomfort. No fever or chills. No low back pain. No significant dysuria or urgency. No vaginal discharge or odor. 2. Discomfort with intercourse. Notes with intercourse feeling pressure like symptoms in the pelvis. Not like something being hip it more pressure discomfort. Has been going on for 6 months. Is consistent with each intercourse.  Past medical history,surgical history, problem list, medications, allergies, family history and social history were all reviewed and documented in the EPIC chart.  Directed ROS with pertinent positives and negatives documented in the history of present illness/assessment and plan.  Exam: Kennon PortelaKim Gardner assistant Vitals:   07/14/16 1150  BP: 130/80   General appearance:  Normal Spine straight without CVA tenderness Abdomen soft nontender without masses guarding rebound Pelvic external BUS vagina with whitish scant discharge. Cervix normal. Uterus grossly normal midline mobile nontender. Adnexa without masses or tenderness.  Assessment/Plan:  25 y.o. G0P0 with:  1. Symptoms and urinalysis consistent with UTI. Will treat with Macrobid 100 mg twice a day 7 days. 2. Vaginal discharge. Wet prep positive for bacterial vaginosis and yeast. Will treat with Flagyl 500 mg twice a day 7 days, alcohol avoidance reviewed and Diflucan 150 mg 1 tab at the beginning and 1 tablet the end of the antibiotic course. Will follow up if symptoms persist or recur. 3. Dyspareunia. Exam is normal without palpable abnormalities. Will start with ultrasound to rule out nonpalpable abnormalities.    Dara LordsFONTAINE,Whitt Auletta P MD, 12:14 PM 07/14/2016

## 2016-07-14 NOTE — Patient Instructions (Signed)
Take the Macrodantin antibiotic twice daily for 7 days Take the Flagyl medication twice daily for 7 days, avoid alcohol while taking. Take the Diflucan pill once at the beginning of the antibiotic course and one at the end for a total of 2 pills.  Follow up for the ultrasound as scheduled.

## 2016-07-14 NOTE — Addendum Note (Signed)
Addended by: Dayna BarkerGARDNER, Leyan Branden K on: 07/14/2016 12:50 PM   Modules accepted: Orders

## 2016-07-17 LAB — URINE CULTURE

## 2016-07-23 ENCOUNTER — Telehealth: Payer: Self-pay | Admitting: *Deleted

## 2016-07-23 NOTE — Telephone Encounter (Signed)
Per [employer] at [employer] Call ref # [id number] US and visit covered at 100% since OPM has been met.   KW CMA 

## 2016-07-30 ENCOUNTER — Encounter: Payer: Self-pay | Admitting: Gynecology

## 2016-07-30 ENCOUNTER — Ambulatory Visit (INDEPENDENT_AMBULATORY_CARE_PROVIDER_SITE_OTHER): Payer: BLUE CROSS/BLUE SHIELD

## 2016-07-30 ENCOUNTER — Ambulatory Visit (INDEPENDENT_AMBULATORY_CARE_PROVIDER_SITE_OTHER): Payer: BLUE CROSS/BLUE SHIELD | Admitting: Gynecology

## 2016-07-30 VITALS — BP 124/80

## 2016-07-30 DIAGNOSIS — N941 Unspecified dyspareunia: Secondary | ICD-10-CM | POA: Diagnosis not present

## 2016-07-30 NOTE — Patient Instructions (Signed)
Follow up if the pain with intercourse persists

## 2016-07-30 NOTE — Progress Notes (Signed)
    Michelle Anderson [birth year] [id number]        25 y.o.  G0P0 presents for ultrasound. Patient has 6 month history of painful intercourse. Notes with every episode with insertion she has pressure discomfort on the lateral sides of her vagina. Not like something being hip more pressure discomfort. Without bladder symptoms such as frequency dysuria or urgency. Was recently treated for UTI. No diarrhea constipation or other abdominal pain.  Past medical history,surgical history, problem list, medications, allergies, family history and social history were all reviewed and documented in the EPIC chart.  Directed ROS with pertinent positives and negatives documented in the history of present illness/assessment and plan.  Exam: Vitals:   07/30/16 1030  BP: 124/80   General appearance:  Normal  Ultrasound transvaginal and transabdominal. Uterus normal size and echotexture. Endometrial echo 2.0 mm. Right and left ovaries visualized and normal. Cul-de-sac negative.  Assessment/Plan:  25 y.o. G0P0 with persistent pressure-like dyspareunia. Currently on oral contraceptives. Feels like she is lubricating adequately. Recommended at this point to try additional lubrication to see if this does not help. Differential to include pelvic adhesions, endometriosis or non-gynecologic like interstitial cystitis discussed. Does not have bladder symptoms or central discomfort but more lateral. We'll recheck urinalysis today. If discomfort continues I discussed the next step would be laparoscopy to rule out pelvic pathology. Patient will follow up with me if her symptoms persist and she wants to pursue this. Otherwise she will follow up when she is due for her annual exam.  Greater than 50% of my time was spent in direct face to face counseling and coordination of care with the patient.    Dara LordsFONTAINE,Suhaylah Wampole P MD, 10:44 AM 07/30/2016

## 2016-07-31 LAB — URINALYSIS W MICROSCOPIC + REFLEX CULTURE
Bilirubin Urine: NEGATIVE
CASTS: NONE SEEN [LPF]
Crystals: NONE SEEN [HPF]
GLUCOSE, UA: NEGATIVE
Ketones, ur: NEGATIVE
NITRITE: NEGATIVE
PROTEIN: NEGATIVE
RBC / HPF: NONE SEEN RBC/HPF (ref <=2)
Specific Gravity, Urine: 1.022 (ref 1.001–1.035)
YEAST: NONE SEEN [HPF]
pH: 5.5 (ref 5.0–8.0)

## 2016-08-01 LAB — URINE CULTURE: Organism ID, Bacteria: NO GROWTH

## 2016-11-10 ENCOUNTER — Telehealth: Payer: Self-pay | Admitting: *Deleted

## 2016-11-10 MED ORDER — NORETHINDRONE ACET-ETHINYL EST 1-20 MG-MCG PO TABS: 1.0000 | ORAL_TABLET | Freq: Every day | ORAL | 8 refills | Status: DC

## 2016-11-10 NOTE — Telephone Encounter (Signed)
Loestrin 1/20 equivalent

## 2016-11-10 NOTE — Telephone Encounter (Signed)
Rx sent 

## 2016-11-10 NOTE — Telephone Encounter (Signed)
Pt  lo Loestrin Fe is not longer covered medication, pt will need another Rx. Please advise

## 2017-06-23 ENCOUNTER — Other Ambulatory Visit: Payer: Self-pay | Admitting: Sports Medicine

## 2017-06-23 DIAGNOSIS — M545 Low back pain: Principal | ICD-10-CM

## 2017-06-23 DIAGNOSIS — G8929 Other chronic pain: Secondary | ICD-10-CM

## 2017-07-03 ENCOUNTER — Ambulatory Visit
Admission: RE | Admit: 2017-07-03 | Discharge: 2017-07-03 | Disposition: A | Discharge status: Home / Self Care | Payer: BLUE CROSS/BLUE SHIELD | Source: Ambulatory Visit | Attending: Sports Medicine | Admitting: Sports Medicine

## 2017-07-03 DIAGNOSIS — M545 Low back pain: Principal | ICD-10-CM

## 2017-07-03 DIAGNOSIS — G8929 Other chronic pain: Secondary | ICD-10-CM

## 2017-07-03 MED ORDER — IOPAMIDOL (ISOVUE-M 200) INJECTION 41%
1.0000 mL | Freq: Once | INTRAMUSCULAR | Status: AC
  Administered 2017-07-03: 1 mL via EPIDURAL

## 2017-07-03 MED ORDER — METHYLPREDNISOLONE ACETATE 40 MG/ML INJ SUSP (RADIOLOG
120.0000 mg | Freq: Once | INTRAMUSCULAR | Status: AC
  Administered 2017-07-03: 120 mg via EPIDURAL

## 2017-07-03 NOTE — Discharge Instructions (Signed)

## 2017-09-24 ENCOUNTER — Other Ambulatory Visit: Payer: Self-pay | Admitting: Sports Medicine

## 2017-09-24 DIAGNOSIS — M545 Low back pain: Principal | ICD-10-CM

## 2017-09-24 DIAGNOSIS — G8929 Other chronic pain: Secondary | ICD-10-CM

## 2017-10-02 ENCOUNTER — Ambulatory Visit
Admission: RE | Admit: 2017-10-02 | Discharge: 2017-10-02 | Disposition: A | Discharge status: Home / Self Care | Payer: BLUE CROSS/BLUE SHIELD | Source: Ambulatory Visit | Attending: Sports Medicine | Admitting: Sports Medicine

## 2017-10-02 DIAGNOSIS — G8929 Other chronic pain: Secondary | ICD-10-CM

## 2017-10-02 DIAGNOSIS — M545 Low back pain: Principal | ICD-10-CM

## 2017-10-02 MED ORDER — METHYLPREDNISOLONE ACETATE 40 MG/ML INJ SUSP (RADIOLOG
120.0000 mg | Freq: Once | INTRAMUSCULAR | Status: AC
  Administered 2017-10-02: 120 mg via EPIDURAL

## 2017-10-02 MED ORDER — IOPAMIDOL (ISOVUE-M 200) INJECTION 41%
1.0000 mL | Freq: Once | INTRAMUSCULAR | Status: AC
  Administered 2017-10-02: 1 mL via EPIDURAL

## 2018-01-25 ENCOUNTER — Other Ambulatory Visit: Payer: Self-pay | Admitting: Sports Medicine

## 2018-01-25 DIAGNOSIS — G8929 Other chronic pain: Secondary | ICD-10-CM

## 2018-01-25 DIAGNOSIS — M545 Low back pain: Principal | ICD-10-CM

## 2018-02-04 ENCOUNTER — Encounter: Payer: Self-pay | Admitting: Radiology

## 2018-02-04 ENCOUNTER — Ambulatory Visit
Admission: RE | Admit: 2018-02-04 | Discharge: 2018-02-04 | Disposition: A | Discharge status: Home / Self Care | Payer: BLUE CROSS/BLUE SHIELD | Source: Ambulatory Visit | Attending: Sports Medicine | Admitting: Sports Medicine

## 2018-02-04 DIAGNOSIS — G8929 Other chronic pain: Secondary | ICD-10-CM

## 2018-02-04 DIAGNOSIS — M545 Low back pain: Principal | ICD-10-CM

## 2018-02-04 MED ORDER — METHYLPREDNISOLONE ACETATE 40 MG/ML INJ SUSP (RADIOLOG
120.0000 mg | Freq: Once | INTRAMUSCULAR | Status: AC
  Administered 2018-02-04: 120 mg via EPIDURAL

## 2018-02-04 MED ORDER — IOPAMIDOL (ISOVUE-M 200) INJECTION 41%
1.0000 mL | Freq: Once | INTRAMUSCULAR | Status: AC
  Administered 2018-02-04: 1 mL via EPIDURAL

## 2018-08-26 ENCOUNTER — Encounter: Payer: Self-pay | Admitting: Gynecology

## 2018-08-26 ENCOUNTER — Ambulatory Visit: Payer: BLUE CROSS/BLUE SHIELD | Admitting: Gynecology

## 2018-08-26 VITALS — BP 122/76 | Ht 67.0 in | Wt 247.0 lb

## 2018-08-26 DIAGNOSIS — Z01419 Encounter for gynecological examination (general) (routine) without abnormal findings: Secondary | ICD-10-CM

## 2018-08-26 DIAGNOSIS — Z13220 Encounter for screening for lipoid disorders: Secondary | ICD-10-CM | POA: Diagnosis not present

## 2018-08-26 DIAGNOSIS — Z113 Encounter for screening for infections with a predominantly sexual mode of transmission: Secondary | ICD-10-CM | POA: Diagnosis not present

## 2018-08-26 MED ORDER — NORETHIN-ETH ESTRAD-FE BIPHAS 1 MG-10 MCG / 10 MCG PO TABS: 1.0000 | ORAL_TABLET | Freq: Every day | ORAL | 11 refills | Status: DC

## 2018-08-26 NOTE — Addendum Note (Signed)
Addended by: Dayna BarkerGARDNER, KIMBERLY K on: 08/26/2018 03:26 PM   Modules accepted: Orders

## 2018-08-26 NOTE — Progress Notes (Signed)
    Michelle Anderson Dillow 14-Feb-1991 [id number]        27 y.o.  G0P0 for annual gynecologic exam.  Without gynecologic complaints.  Wants to start back on oral contraceptives.  Used LoLoestrin because other brands seem to cause emotional swings and she did well with this.  Past medical history,surgical history, problem list, medications, allergies, family history and social history were all reviewed and documented as reviewed in the EPIC chart.  ROS:  Performed with pertinent positives and negatives included in the history, assessment and plan.   Additional significant findings : None   Exam: Kennon PortelaKim Gardner assistant Vitals:   08/26/18 1423  BP: 122/76  Weight: 247 lb (112 kg)  Height: 5\' 7"  (1.702 Anderson)   Body mass index is 38.69 kg/Anderson.  General appearance:  Normal affect, orientation and appearance. Skin: Grossly normal HEENT: Without gross lesions.  No cervical or supraclavicular adenopathy. Thyroid normal.  Lungs:  Clear without wheezing, rales or rhonchi Cardiac: RR, without RMG Abdominal:  Soft, nontender, without masses, guarding, rebound, organomegaly or hernia Breasts:  Examined lying and sitting without masses, retractions, discharge or axillary adenopathy. Pelvic:  Ext, BUS, Vagina: Normal  Cervix: Normal.  Pap smear done GC/Chlamydia  Uterus: Anteverted, normal size, shape and contour, midline and mobile nontender   Adnexa: Without masses or tenderness    Anus and perineum: Normal   Rectovaginal: Normal sphincter tone without palpated masses or tenderness.    Assessment/Plan:  27 y.o. G0P0 female for annual gynecologic exam with regular menses.   1. Contraception.  Patient is going to start back on LoLoestrin.  She will call if she has any issues with this. 2. STD screening.  GC/chlamydia, HIV, RPR, hepatitis B, hepatitis C done. 3. Breast health.  Breast exam normal today.  SBE monthly reviewed.  Will plan screening mammogram at age 27. 4. Pap smear 2017.  Pap smear done  today.  No history of abnormal Pap smears previously. 5. Health maintenance.  Baseline CBC, glucose and lipid profile ordered.  Follow-up 1 year, sooner as needed.   Dara Lordsimothy P Edy Mcbane MD, 2:57 PM 08/26/2018

## 2018-08-26 NOTE — Patient Instructions (Signed)
Start back on the birth control pills as we discussed.

## 2018-08-27 ENCOUNTER — Other Ambulatory Visit: Payer: Self-pay | Admitting: Gynecology

## 2018-08-27 ENCOUNTER — Telehealth: Payer: Self-pay

## 2018-08-27 DIAGNOSIS — E7800 Pure hypercholesterolemia, unspecified: Secondary | ICD-10-CM

## 2018-08-27 DIAGNOSIS — E782 Mixed hyperlipidemia: Secondary | ICD-10-CM

## 2018-08-27 LAB — LIPID PANEL
Cholesterol: 240 mg/dL — ABNORMAL HIGH (ref <200)
HDL: 52 mg/dL (ref >50)
LDL Cholesterol (Calc): 152 mg/dL (calc) — ABNORMAL HIGH
NON-HDL CHOLESTEROL (CALC): 188 mg/dL — AB (ref <130)
Total CHOL/HDL Ratio: 4.6 (calc) (ref <5.0)
Triglycerides: 222 mg/dL — ABNORMAL HIGH (ref <150)

## 2018-08-27 LAB — CBC WITH DIFFERENTIAL/PLATELET
BASOS ABS: 12 {cells}/uL (ref 0–200)
Basophils Relative: 0.2 %
Eosinophils Absolute: 198 cells/uL (ref 15–500)
Eosinophils Relative: 3.3 %
HEMATOCRIT: 39.8 % (ref 35.0–45.0)
Hemoglobin: 13.5 g/dL (ref 11.7–15.5)
Lymphs Abs: 2034 cells/uL (ref 850–3900)
MCH: 29.2 pg (ref 27.0–33.0)
MCHC: 33.9 g/dL (ref 32.0–36.0)
MCV: 86.1 fL (ref 80.0–100.0)
MONOS PCT: 10.6 %
MPV: 10.8 fL (ref 7.5–12.5)
NEUTROS PCT: 52 %
Neutro Abs: 3120 cells/uL (ref 1500–7800)
Platelets: 312 10*3/uL (ref 140–400)
RBC: 4.62 10*6/uL (ref 3.80–5.10)
RDW: 13.5 % (ref 11.0–15.0)
TOTAL LYMPHOCYTE: 33.9 %
WBC mixed population: 636 cells/uL (ref 200–950)
WBC: 6 10*3/uL (ref 3.8–10.8)

## 2018-08-27 LAB — PAP IG W/ RFLX HPV ASCU

## 2018-08-27 LAB — C. TRACHOMATIS/N. GONORRHOEAE RNA
C. TRACHOMATIS RNA, TMA: NOT DETECTED
N. GONORRHOEAE RNA, TMA: NOT DETECTED

## 2018-08-27 LAB — HIV ANTIBODY (ROUTINE TESTING W REFLEX): HIV 1&2 Ab, 4th Generation: NONREACTIVE

## 2018-08-27 LAB — RPR: RPR Ser Ql: NONREACTIVE

## 2018-08-27 LAB — HEPATITIS B SURFACE ANTIGEN: HEP B S AG: NONREACTIVE

## 2018-08-27 LAB — HEPATITIS C ANTIBODY
HEP C AB: NONREACTIVE
SIGNAL TO CUT-OFF: 0.01 (ref <1.00)

## 2018-08-27 LAB — GLUCOSE, RANDOM: Glucose, Bld: 87 mg/dL (ref 65–99)

## 2018-08-27 NOTE — Telephone Encounter (Signed)
"  Drug change request" received from pharmacy. "Plan does not cover the medication prescribed (Lo Loestrin FE tabs -28). Please check with MD for a substitution."

## 2018-08-30 NOTE — Telephone Encounter (Signed)
Left detailed message in voicemail.

## 2018-08-30 NOTE — Telephone Encounter (Signed)
I have discussed this with the patient about it being brand only.  She was having issues with emotional swings with other higher dose pills.  We gave her discount cards for the LoLoestrin.  Not sure if insurance would make an exception otherwise it will be patient's choice from an expense standpoint.

## 2018-09-02 ENCOUNTER — Other Ambulatory Visit: Payer: BLUE CROSS/BLUE SHIELD

## 2018-09-02 DIAGNOSIS — E782 Mixed hyperlipidemia: Secondary | ICD-10-CM

## 2018-09-02 DIAGNOSIS — E7800 Pure hypercholesterolemia, unspecified: Secondary | ICD-10-CM

## 2018-09-03 LAB — LIPID PANEL
Cholesterol: 213 mg/dL — ABNORMAL HIGH (ref <200)
HDL: 43 mg/dL — ABNORMAL LOW (ref >50)
LDL CHOLESTEROL (CALC): 143 mg/dL — AB
NON-HDL CHOLESTEROL (CALC): 170 mg/dL — AB (ref <130)
TRIGLYCERIDES: 138 mg/dL (ref <150)
Total CHOL/HDL Ratio: 5 (calc) — ABNORMAL HIGH (ref <5.0)

## 2018-09-10 IMAGING — XA Imaging study
2 series · 2 of 2 positions shown · non-contrast
Comparison: none

CLINICAL DATA: Lumbosacral spondylosis without myelopathy. Low back
and right lower extremity pain. Good response to multiple prior
epidural injections at L4-5, although the patient feels the duration
of relief is slowly decreasing over time.

[Series 1: ortho standard · 1 of 1 slices shown (1 of 2)]
[im 1/1]
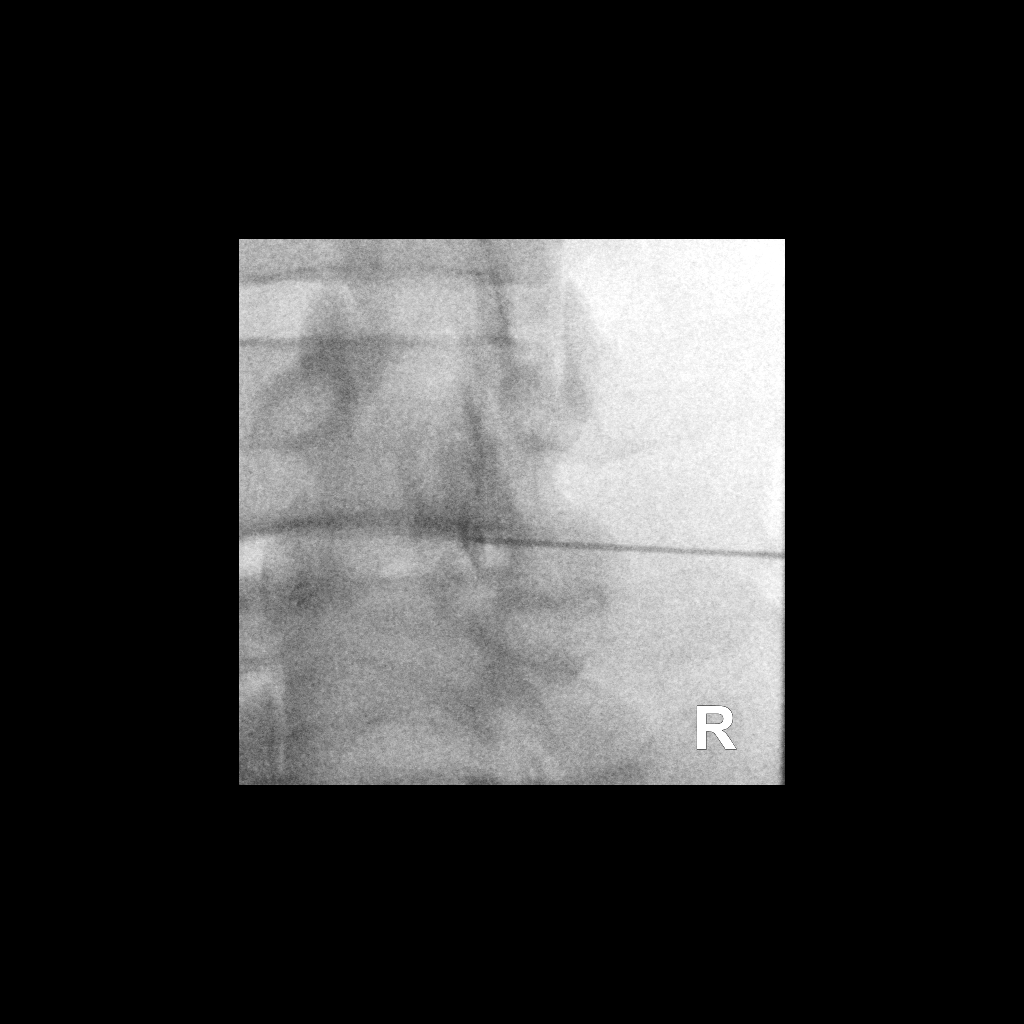

[Series 2: ortho standard · 1 of 1 slices shown (2 of 2)]
[im 1/1]
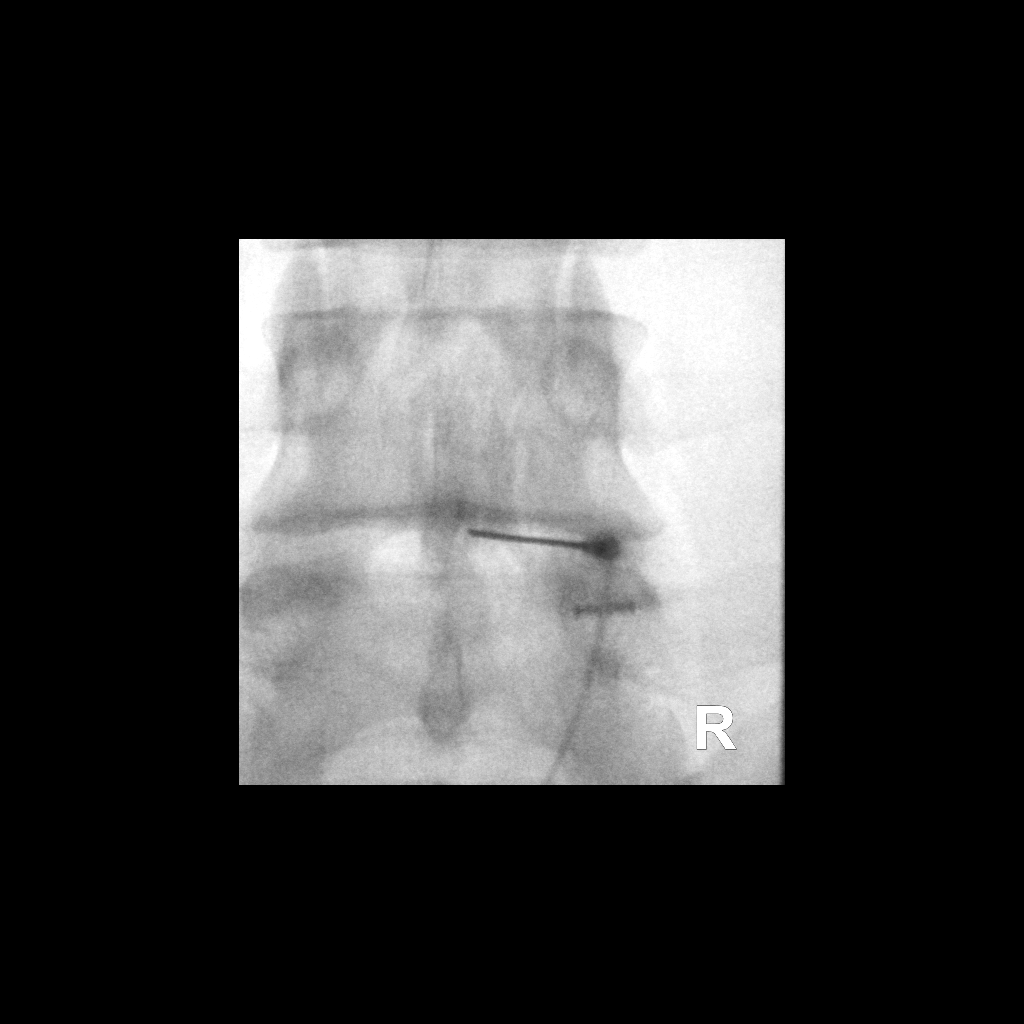

[2 of 2 positions shown; findings below may reference images not displayed]

FLUOROSCOPY TIME:  Radiation Exposure Index (as provided by the
fluoroscopic device): 10.85 microGray*m^2

Fluoroscopy Time (in minutes and seconds):  3 seconds

PROCEDURE:
The procedure, risks, benefits, and alternatives were explained to
the patient. Questions regarding the procedure were encouraged and
answered. The patient understands and consents to the procedure.

LUMBAR EPIDURAL INJECTION:

An interlaminar approach was performed on the right at L4-5. The
overlying skin was cleansed and anesthetized. A 3.5 inch 20 gauge
epidural needle was advanced using loss-of-resistance technique.

DIAGNOSTIC EPIDURAL INJECTION:

Injection of Isovue-M 200 shows a good epidural pattern with spread
above and below the level of needle placement, primarily on the
right. No vascular opacification is seen.

THERAPEUTIC EPIDURAL INJECTION:

120 mg of Depo-Medrol mixed with 3 mL of 1% lidocaine were
instilled. The procedure was well-tolerated, and the patient was
discharged thirty minutes following the injection in good condition.

COMPLICATIONS:
None
IMPRESSION: Technically successful lumbar interlaminar epidural injection on the
right at L4-5.

## 2019-07-04 ENCOUNTER — Encounter: Payer: Self-pay | Admitting: Gynecology

## 2020-01-12 ENCOUNTER — Telehealth: Payer: Self-pay | Admitting: *Deleted

## 2020-01-12 MED ORDER — LO LOESTRIN FE 1 MG-10 MCG / 10 MCG PO TABS: 1.0000 | ORAL_TABLET | Freq: Every day | ORAL | 0 refills | Status: DC

## 2020-01-12 NOTE — Telephone Encounter (Signed)
Patient called requesting refill on birth control pills,annual exam scheduled 02/06/20.

## 2020-02-06 ENCOUNTER — Encounter: Payer: BLUE CROSS/BLUE SHIELD | Admitting: Nurse Practitioner

## 2020-02-07 ENCOUNTER — Telehealth: Payer: Self-pay | Admitting: *Deleted

## 2020-02-07 NOTE — Telephone Encounter (Signed)
Patient called requesting refill on lo Loestrin FE, canceled annual exam that was scheduled on 02/06/20. I called to discuss this with patient asked her to call me back.

## 2020-02-08 MED ORDER — LO LOESTRIN FE 1 MG-10 MCG / 10 MCG PO TABS: 1.0000 | ORAL_TABLET | Freq: Every day | ORAL | 0 refills | Status: DC

## 2020-02-08 NOTE — Telephone Encounter (Signed)
Patient called back stating this office is no longer in network with her insurance carrier, she will need to find a new GYN office. Asked if 1 pack could be sent to pharmacy she only has 1 pill left. Rx sent.

## 2020-02-29 ENCOUNTER — Telehealth: Payer: Self-pay | Admitting: *Deleted

## 2020-02-29 MED ORDER — LO LOESTRIN FE 1 MG-10 MCG / 10 MCG PO TABS: 1.0000 | ORAL_TABLET | Freq: Every day | ORAL | 0 refills | Status: AC

## 2020-02-29 NOTE — Telephone Encounter (Signed)
Patient called requesting 1 last pack of pills for Lo Loestrin FE 1/10 mcg tablet. Has a new GYN provider that is in network with her insurance,appointment schedule on 03/19/20. 1 pack sent.

## 2020-08-23 ENCOUNTER — Other Ambulatory Visit: Payer: Self-pay

## 2021-01-29 ENCOUNTER — Other Ambulatory Visit: Payer: Self-pay | Admitting: Nurse Practitioner
